# Patient Record
Sex: Female | Born: 1970 | Race: Black or African American | Hispanic: No | Marital: Single | Smoking: Never smoker
Health system: Southern US, Community
[De-identification: ages and names within clinical notes are randomized; demographics above are authoritative.]

## PROBLEM LIST (undated history)

## (undated) DIAGNOSIS — T7840XA Allergy, unspecified, initial encounter: Secondary | ICD-10-CM

## (undated) HISTORY — PX: MYOMECTOMY: SHX85

## (undated) HISTORY — DX: Allergy, unspecified, initial encounter: T78.40XA

---

## 2003-02-17 ENCOUNTER — Other Ambulatory Visit: Admission: RE | Admit: 2003-02-17 | Discharge: 2003-02-17 | Payer: Self-pay | Admitting: Gynecology

## 2004-12-31 ENCOUNTER — Other Ambulatory Visit: Admission: RE | Admit: 2004-12-31 | Discharge: 2004-12-31 | Payer: Self-pay | Admitting: *Deleted

## 2006-02-17 ENCOUNTER — Other Ambulatory Visit: Admission: RE | Admit: 2006-02-17 | Discharge: 2006-02-17 | Payer: Self-pay | Admitting: Gynecology

## 2008-09-29 ENCOUNTER — Other Ambulatory Visit: Admission: RE | Admit: 2008-09-29 | Discharge: 2008-09-29 | Payer: Self-pay | Admitting: Gynecology

## 2008-10-03 ENCOUNTER — Ambulatory Visit (HOSPITAL_COMMUNITY): Admission: RE | Admit: 2008-10-03 | Discharge: 2008-10-03 | Payer: Self-pay | Admitting: Gynecology

## 2012-07-21 ENCOUNTER — Ambulatory Visit (INDEPENDENT_AMBULATORY_CARE_PROVIDER_SITE_OTHER): Payer: BC Managed Care – PPO | Admitting: Family Medicine

## 2012-07-21 VITALS — BP 126/80 | HR 74 | Temp 98.0°F | Resp 18 | Ht 62.5 in | Wt 149.0 lb

## 2012-07-21 DIAGNOSIS — L299 Pruritus, unspecified: Secondary | ICD-10-CM

## 2012-07-21 DIAGNOSIS — L509 Urticaria, unspecified: Secondary | ICD-10-CM

## 2012-07-21 MED ORDER — METHYLPREDNISOLONE 4 MG PO KIT: PACK | ORAL | Status: DC

## 2012-07-21 MED ORDER — METHYLPREDNISOLONE SODIUM SUCC 125 MG IJ SOLR
125.0000 mg | Freq: Once | INTRAMUSCULAR | Status: AC
  Administered 2012-07-21: 125 mg via INTRAMUSCULAR

## 2012-07-21 NOTE — Progress Notes (Signed)
  [facility]:  Office Visit  Chief Complaint:  Chief Complaint  Patient presents with  . itching    all over body since Monday morning    HPI: Hailey Castillo is a 41 y.o. female who complains of  2 day h/o itching all over body. She has red itchy spots all over, eyelids puffy, on face and neck and chest, back legs, and head. On Sunday evening she went to friend's hous out in woods but did not have nay insects bites that she knows of. She also went and ate at buffet at [facility], had lots of grains. She has been on new Vitalyzme enzyme supplemets for fibroids x 10 days but did not have any issues until 2 days ago. She denies any new detergents/soaps, clothes, abx. No prior h/o hives. Denies SOB/CP. Denies food allergies.   Past Medical History  Diagnosis Date  . Allergy    Past Surgical History  Procedure Date  . Myomectomy    History   Social History  . Marital Status: Single    Spouse Name: N/A    Number of Children: N/A  . Years of Education: N/A   Social History Main Topics  . Smoking status: Never Smoker   . Smokeless tobacco: None  . Alcohol Use: No  . Drug Use: No  . Sexually Active: None   Other Topics Concern  . None   Social History Narrative  . None   Family History  Problem Relation Age of Onset  . Hypertension Mother   . Hypertension Father    No Known Allergies Prior to Admission medications   Not on File     ROS: The patient denies fevers, chills, night sweats, unintentional weight loss, chest pain, palpitations, wheezing, dyspnea on exertion, nausea, vomiting, abdominal pain, dysuria, hematuria, melena, numbness, weakness, or tingling.   All other systems have been reviewed and were otherwise negative with the exception of those mentioned in the HPI and as above.    PHYSICAL EXAM: Filed Vitals:   07/21/12 1441  BP: 126/80  Pulse: 74  Temp: 98 F (36.7 C)  Resp: 18   Filed Vitals:   07/21/12 1441  Height: 5' 2.5"  (1.588 m)  Weight: 149 lb (67.586 kg)   Body mass index is 26.82 kg/(m^2).  General: Alert, no acute distress HEENT:  Normocephalic, atraumatic, oropharynx patent.  Cardiovascular:  Regular rate and rhythm, no rubs murmurs or gallops.  No Carotid bruits, radial pulse intact. No pedal edema.  Respiratory: Clear to auscultation bilaterally.  No wheezes, rales, or rhonchi.  No cyanosis, no use of accessory musculature GI: No organomegaly, abdomen is soft and non-tender, positive bowel sounds.  No masses. Skin: + diffuse erythematous maculopapular rash Neurologic: Facial musculature symmetric. Psychiatric: Patient is appropriate throughout our interaction. Lymphatic: No cervical lymphadenopathy Musculoskeletal: Gait intact.   LABS: No results found for this or any previous visit.   EKG/XRAY:   Primary read interpreted by Dr. Conley Rolls at [facility].   ASSESSMENT/PLAN: Encounter Diagnoses  Name Primary?  . Hives Yes  . Urticaria   . Itching    ?Etiology.  Systemic so maybe related to what she ingested . Will give IM steroids since on face and chest and eyelid swelling Solumedrol 125 mg Im x 1 Medrol Dose pack Benadryl 25 mg q6-8 ghrs until resolved Go to ER prn CP, SOB   ,  PHUONG, DO 07/21/2012 3:04 PM

## 2012-07-21 NOTE — Patient Instructions (Signed)
Hives Hives (urticaria) are itchy, red, swollen patches on the skin. They may change size, shape, and location quickly and repeatedly. Hives that occur deeper in the skin can cause swelling of the hands, feet, and face. Hives may be an allergic reaction to something you ate, touched, or put on your skin. Hives can also be a reaction to cold, heat, viral infections, medication, insect bites, or emotional stress. Often the cause is hard to find. Hives can come and go for several days to several weeks. Hives are not contagious. HOME CARE INSTRUCTIONS   If the cause of the hives is known, avoid exposure to that source.   To relieve itching and rash:   Apply cold compresses to the skin or take cool water baths. Do not take hot baths or showers because the warmth will make the itching worse.   The best medicine for hives is an antihistamine. An antihistamine will not cure hives, but it will reduce their severity. You can use an antihistamine available over the counter. This medicine may make you sleepy. You should not drive while using this medicine.   Take or give an antihistamine as directed by your pharmacist or caregiver.   Other medications may be prescribed for itching. Use these as directed.   You should wear loose fitting clothing, including undergarments, as skin irritations may make hives worse.   Follow-up as directed by your caregiver.  SEEK MEDICAL CARE IF:   You still have considerable itching after taking the medication (prescribed or purchased over the counter).   Joint swelling or pain occurs.   You are not improving or are getting worse.  SEEK IMMEDIATE MEDICAL CARE IF:   You have a fever.   You notice any swelling of the lips, tongue, face or neck.   You have shortness of breath, difficulty breathing or swallowing, or tightness in the throat or chest.   Belly (abdominal) pain develops.   You are feeling very sick.   Your hives continue to spread.  These may be the  first signs of a life-threatening allergic reaction. THIS IS AN EMERGENCY. Call 911 for medical help. Document Released: 11/21/2004 Document Revised: 10/03/2011 Document Reviewed: 11/08/2008 Exit[employer] Patient Information 2012 [employer]. 

## 2013-09-25 ENCOUNTER — Ambulatory Visit (INDEPENDENT_AMBULATORY_CARE_PROVIDER_SITE_OTHER): Payer: BC Managed Care – PPO | Admitting: Family Medicine

## 2013-09-25 VITALS — BP 110/64 | HR 79 | Temp 98.6°F | Resp 18 | Ht 63.0 in | Wt 148.0 lb

## 2013-09-25 DIAGNOSIS — H109 Unspecified conjunctivitis: Secondary | ICD-10-CM

## 2013-09-25 MED ORDER — TOBRAMYCIN-DEXAMETHASONE 0.3-0.1 % OP SUSP: 1.0000 [drp] | Freq: Four times a day (QID) | OPHTHALMIC | Status: DC

## 2013-09-25 NOTE — Progress Notes (Signed)
    This chart was scribed for Elvina Sidle, MD  by Arlan Organ, ED Scribe. This patient was seen in room Room 13 and the patient's care was started 11:42 AM.  Patient Name: Hailey Castillo Date of Birth: 01-09-1971 Medical Record Number: [id number] Gender: female Date of Encounter: 09/25/2013  Chief Complaint: Eye Pain   History of Present Illness:  Hailey Castillo is a 42 y.o. very pleasant female patient who presents with the following:  Pt presents with a gradual onset, constant right eye problem that started 2 days ago, but has worsened in the last few hours. She describes the discomfort as "burning and scratchy". Pt states bright light worsens the discomfort, and ibuprofen relieves the pain temporarily. She says when she is driving, she closes the effected eye to keep from bright light causing her discomfort. She reports severe pain at night time.  Recently went to [employer] for a conference.  There are no active problems to display for this patient.  Past Medical History  Diagnosis Date  . Allergy    Past Surgical History  Procedure Laterality Date  . Myomectomy     History  Substance Use Topics  . Smoking status: Never Smoker   . Smokeless tobacco: Not on file  . Alcohol Use: No   Family History  Problem Relation Age of Onset  . Hypertension Mother   . Hypertension Father    Allergies  Allergen Reactions  . Other     Hay fever     Medication list has been reviewed and updated.  Current Outpatient Prescriptions on File Prior to Visit  Medication Sig Dispense Refill  . methylPREDNISolone (MEDROL, PAK,) 4 MG tablet follow package directions  21 tablet  0   No current facility-administered medications on file prior to visit.    Review of Systems: Consitutional: No fever or chills Eye: Positive for right eye pain  Physical Examination: Filed Vitals:   09/25/13 1126  BP: 110/64  Pulse: 79  Temp: 98.6 F (37 C)  Resp: 18   @vitals2 @ Body  mass index is 26.22 kg/(m^2). Ideal Body Weight: @FLOWAMB ([id number])@ Right eye was injected diffusely. 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 Fundus is normal. I lid was everted and no foreign body was noted Conjunctiva was stained with fluorescin and there was no significant uptake over the cornea. She did have significant uptake in the lower third of the sclera.  Assessment and Plan:  Conjunctivitis - Plan: tobramycin-dexamethasone (TOBRADEX) ophthalmic solution  Signed, Elvina Sidle, MD   Elvina Sidle, MD

## 2013-09-25 NOTE — Patient Instructions (Addendum)
Please call me if you are not improving in the next 24 to 48 hours.   Conjunctivitis Conjunctivitis is commonly called "pink eye." Conjunctivitis can be caused by bacterial or viral infection, allergies, or injuries. There is usually redness of the lining of the eye, itching, discomfort, and sometimes discharge. There may be deposits of matter along the eyelids. A viral infection usually causes a watery discharge, while a bacterial infection causes a yellowish, thick discharge. Pink eye is very contagious and spreads by direct contact. You may be given antibiotic eyedrops as part of your treatment. Before using your eye medicine, remove all drainage from the eye by washing gently with warm water and cotton balls. Continue to use the medication until you have awakened 2 mornings in a row without discharge from the eye. Do not rub your eye. This increases the irritation and helps spread infection. Use separate towels from other household members. Wash your hands with soap and water before and after touching your eyes. Use cold compresses to reduce pain and sunglasses to relieve irritation from light. Do not wear contact lenses or wear eye makeup until the infection is gone. SEEK MEDICAL CARE IF:   Your symptoms are not better after 3 days of treatment.  You have increased pain or trouble seeing.  The outer eyelids become very red or swollen. Document Released: 11/21/2004 Document Revised: 01/06/2012 Document Reviewed: 10/14/2005 [facility] Patient Information 2014 [place], [place].

## 2013-09-26 ENCOUNTER — Telehealth: Payer: Self-pay

## 2013-09-26 NOTE — Telephone Encounter (Signed)
Spoke with pt, advised she didn't need the methylprednisolone because it wasn't prescribed. It was a medication she was previously on that we didn't take off. Pt understood.

## 2013-09-26 NOTE — Telephone Encounter (Signed)
Patient states that she would like Methylprednisolone called in to [facility] on [address]. States that she was seen yesterday (09/25/2013) for conjunctivitis and this medication was the only on the list that she was unable to pick up.  [phone]

## 2013-09-27 ENCOUNTER — Ambulatory Visit (INDEPENDENT_AMBULATORY_CARE_PROVIDER_SITE_OTHER): Payer: BC Managed Care – PPO | Admitting: Family Medicine

## 2013-09-27 VITALS — BP 100/70 | HR 80 | Temp 98.5°F | Resp 16 | Ht 62.0 in | Wt 148.0 lb

## 2013-09-27 DIAGNOSIS — H109 Unspecified conjunctivitis: Secondary | ICD-10-CM

## 2013-09-27 DIAGNOSIS — H5710 Ocular pain, unspecified eye: Secondary | ICD-10-CM

## 2013-09-27 DIAGNOSIS — H5711 Ocular pain, right eye: Secondary | ICD-10-CM

## 2013-09-27 NOTE — Progress Notes (Signed)
42 year old woman seen 2 days ago because of right eye pain and redness. She's not had a conjunctivitis and was given TobraDex. She initially got some improvement but over the last 48 hours as noted continued pain in that right eye. Her vision is unchanged. She's not wear contacts, only wears glasses. The pain is constant.  Objective: No acute distress Injected right eye diffusely with pupils equal and reactive to light  Assessment: This appears to be a persistent conjunctival irritation of uncertain etiology. She definitely needs further evaluation at this point.  Plan: Refer to ophthalmologist today  Signed, Sheila Oats.D.

## 2013-09-27 NOTE — Patient Instructions (Signed)
You will have to see an eye doctor today. We are going to send you to Dr Dione Booze at [facility]. His office is at [address] [employer].

## 2014-05-14 ENCOUNTER — Ambulatory Visit (INDEPENDENT_AMBULATORY_CARE_PROVIDER_SITE_OTHER): Payer: 59

## 2014-05-14 ENCOUNTER — Ambulatory Visit (INDEPENDENT_AMBULATORY_CARE_PROVIDER_SITE_OTHER): Payer: 59 | Admitting: Family Medicine

## 2014-05-14 VITALS — BP 102/64 | HR 84 | Temp 98.2°F | Resp 16 | Ht 63.0 in | Wt 136.0 lb

## 2014-05-14 DIAGNOSIS — S8000XA Contusion of unspecified knee, initial encounter: Secondary | ICD-10-CM

## 2014-05-14 DIAGNOSIS — S8002XA Contusion of left knee, initial encounter: Secondary | ICD-10-CM

## 2014-05-14 DIAGNOSIS — M25562 Pain in left knee: Secondary | ICD-10-CM

## 2014-05-14 DIAGNOSIS — M25569 Pain in unspecified knee: Secondary | ICD-10-CM

## 2014-05-14 NOTE — Progress Notes (Addendum)
   Subjective:  This chart was scribed for Robyn Haber, MD by Donato Schultz, Medical Scribe. This patient was seen in Room 13 and the patient's care was started at 10:13 AM.   Patient ID: Hailey Castillo, female    DOB: 12-13-1970, 42 y.o.   MRN: [id number]  HPI HPI Comments: Hailey Castillo is a 43 y.o. female who presents to the [facility] complaining of constant knee pain with associated swelling that started three days ago after the patient fell onto a hardwood floor.  She states that she landed on her knee and denies head injury or LOC at the time of the incident.  The patient denies any other injuries.  She states that she has applied ice and taken Advil at the time of her incident with no relief to her symptoms.  She states that she has not taken Advil over the last two days.  The patient states that she works for [employer] in [place], [place].  She states that she is going to [place] two [place] from now.     Past Medical History  Diagnosis Date  . Allergy    Past Surgical History  Procedure Laterality Date  . Myomectomy     Family History  Problem Relation Age of Onset  . Hypertension Mother   . Hypertension Father    History   Social History  . Marital Status: Single    Spouse Name: N/A    Number of Children: N/A  . Years of Education: N/A   Occupational History  . Not on file.   Social History Main Topics  . Smoking status: Never Smoker   . Smokeless tobacco: Not on file  . Alcohol Use: No  . Drug Use: No  . Sexual Activity: Not on file   Other Topics Concern  . Not on file   Social History Narrative  . No narrative on file   Allergies  Allergen Reactions  . Other     Hay fever     Review of Systems  Musculoskeletal: Positive for arthralgias, gait problem and joint swelling.  All other systems reviewed and are negative.    Objective:  Physical Exam  Nursing note and vitals reviewed. Constitutional: She is oriented to person, place, and  time. She appears well-developed and well-nourished.  HENT:  Head: Normocephalic and atraumatic.  Eyes: EOM are normal.  Neck: Normal range of motion.  Cardiovascular: Normal rate.   Pulmonary/Chest: Effort normal.  Musculoskeletal: Normal range of motion.  Neurological: She is alert and oriented to person, place, and time.  Antalgic gait favoring right side  Skin: Skin is warm and dry.  Psychiatric: She has a normal mood and affect. Her behavior is normal.   [facility] reading (PRIMARY) by  Dr. Joseph Art:  Left knee:  .negative    BP 102/64  Pulse 84  Temp(Src) 98.2 F (36.8 C)  Resp 16  Ht 5\' 3"  (1.6 m)  Wt 136 lb (61.689 kg)  BMI 24.10 kg/m2  SpO2 100%  LMP 04/14/2014 Assessment & Plan:   Recommend Advil 400 tid for 2-3 days and call me back if pain is not subsiding  I personally performed the services described in this documentation, which was scribed in my presence. The recorded information has been reviewed and is accurate.

## 2014-12-21 ENCOUNTER — Ambulatory Visit (INDEPENDENT_AMBULATORY_CARE_PROVIDER_SITE_OTHER): Payer: 59 | Admitting: Family Medicine

## 2014-12-21 VITALS — BP 102/62 | HR 89 | Temp 98.7°F | Resp 18 | Ht 63.0 in | Wt 143.0 lb

## 2014-12-21 DIAGNOSIS — D259 Leiomyoma of uterus, unspecified: Secondary | ICD-10-CM

## 2014-12-21 DIAGNOSIS — D649 Anemia, unspecified: Secondary | ICD-10-CM

## 2014-12-21 DIAGNOSIS — D509 Iron deficiency anemia, unspecified: Secondary | ICD-10-CM

## 2014-12-21 LAB — POCT CBC
Granulocyte percent: 58.2 %G (ref 37–80)
HCT, POC: 30.1 % — AB (ref 37.7–47.9)
Hemoglobin: 8.8 g/dL — AB (ref 12.2–16.2)
LYMPH, POC: 1.8 (ref 0.6–3.4)
MCH: 20.7 pg — AB (ref 27–31.2)
MCHC: 29.3 g/dL — AB (ref 31.8–35.4)
MCV: 70.6 fL — AB (ref 80–97)
MID (CBC): 0.2 (ref 0–0.9)
MPV: 8.4 fL (ref 0–99.8)
PLATELET COUNT, POC: 331 10*3/uL (ref 142–424)
POC GRANULOCYTE: 2.7 (ref 2–6.9)
POC LYMPH PERCENT: 38.3 %L (ref 10–50)
POC MID %: 3.5 % (ref 0–12)
RBC: 4.25 M/uL (ref 4.04–5.48)
RDW, POC: 28.3 %
WBC: 4.6 10*3/uL (ref 4.6–10.2)

## 2014-12-21 LAB — VITAMIN B12: Vitamin B-12: 712 pg/mL (ref 211–911)

## 2014-12-21 LAB — IRON AND TIBC
%SAT: 16 % — AB (ref 20–55)
Iron: 72 ug/dL (ref 42–145)
TIBC: 442 ug/dL (ref 250–470)
UIBC: 370 ug/dL (ref 125–400)

## 2014-12-21 NOTE — Progress Notes (Signed)
Subjective: Patient has a long history of uterine fibroids. She has had a myomectomy done in 2010. She has been seeing someone at [facility] surgery and they said she needed to be treated for her anemia before they could proceed further. They were interested in giving her Lupron and shrinking the uterus before doing another myomectomy. The past she had seen the had of OB/GYN at [facility]. Her hemoglobin was 7 at the surgeon's office a little over 3 weeks ago. They prescribed some very expensive medication for her, but she's been taking some OTC iron and is now starting on a one-day vitamin.  Patient has never had children and desires to preserve her uterus to give herself a chance still.  Objective: No major distress. She does appear pale in her conjunctiva and mucosa and fingernails. Chest is clear. Heart regular without murmurs. Abdomen soft but she has a massive fibroid uterus about 21-22 weeks size on palpation. It is lobulated and a little more to the left.   Results for orders placed or performed in visit on 12/21/14  POCT CBC  Result Value Ref Range   WBC 4.6 4.6 - 10.2 K/uL   Lymph, poc 1.8 0.6 - 3.4   POC LYMPH PERCENT 38.3 10 - 50 %L   MID (cbc) 0.2 0 - 0.9   POC MID % 3.5 0 - 12 %M   POC Granulocyte 2.7 2 - 6.9   Granulocyte percent 58.2 37 - 80 %G   RBC 4.25 4.04 - 5.48 M/uL   Hemoglobin 8.8 (A) 12.2 - 16.2 g/dL   HCT, POC 30.1 (A) 37.7 - 47.9 %   MCV 70.6 (A) 80 - 97 fL   MCH, POC 20.7 (A) 27 - 31.2 pg   MCHC 29.3 (A) 31.8 - 35.4 g/dL   RDW, POC 28.3 %   Platelet Count, POC 331 142 - 424 K/uL   MPV 8.4 0 - 99.8 fL   Assessment: Anemia, probably iron deficient Large uterine fibroids  Plan: Continue the iron. Return in about 3 weeks. I suspect her hemoglobin will be above 11 by that time. Then I think we can start the process of getting her back into the heads of the surgeon's. She is considering going back over to [facility]. Continue her One-A-Day vitamin. Take the  iron twice daily for 1 month, then once daily.

## 2014-12-21 NOTE — Patient Instructions (Signed)
Continue iron twice daily if tolerated for 1 month, then decrease to once daily  Take a little stool softener if necessary  Continue the One-A-Day vitamin  Return in 3 weeks for a recheck

## 2015-01-18 ENCOUNTER — Ambulatory Visit (INDEPENDENT_AMBULATORY_CARE_PROVIDER_SITE_OTHER): Payer: 59 | Admitting: Family Medicine

## 2015-01-18 VITALS — BP 124/74 | HR 68 | Temp 98.2°F | Resp 17 | Ht 63.0 in | Wt 148.0 lb

## 2015-01-18 DIAGNOSIS — D509 Iron deficiency anemia, unspecified: Secondary | ICD-10-CM | POA: Diagnosis not present

## 2015-01-18 LAB — POCT CBC
GRANULOCYTE PERCENT: 59.4 % (ref 37–80)
HCT, POC: 38.8 % (ref 37.7–47.9)
Hemoglobin: 11.7 g/dL — AB (ref 12.2–16.2)
Lymph, poc: 1.6 (ref 0.6–3.4)
MCH, POC: 24.8 pg — AB (ref 27–31.2)
MCHC: 30.3 g/dL — AB (ref 31.8–35.4)
MCV: 81.8 fL (ref 80–97)
MID (CBC): 0.3 (ref 0–0.9)
MPV: 7.9 fL (ref 0–99.8)
PLATELET COUNT, POC: 276 10*3/uL (ref 142–424)
POC Granulocyte: 2.7 (ref 2–6.9)
POC LYMPH %: 34.8 % (ref 10–50)
POC MID %: 5.8 %M (ref 0–12)
RBC: 4.75 M/uL (ref 4.04–5.48)
RDW, POC: 27.3 %
WBC: 4.6 10*3/uL (ref 4.6–10.2)

## 2015-01-18 LAB — FERRITIN: FERRITIN: 12 ng/mL (ref 10–291)

## 2015-01-18 NOTE — Progress Notes (Signed)
Subjective: Patient has been feeling stronger since being on the him. No other major complaints.  Objective: Did not reexamine her today. Reviewed her previous labs which showed severe anemia with hemoglobin 8.8 and a low TIBC  Assessment: Deficiency anemia  Plan: CBC, iron, TIBC, ferritin  Results for orders placed or performed in visit on 01/18/15  POCT CBC  Result Value Ref Range   WBC 4.6 4.6 - 10.2 K/uL   Lymph, poc 1.6 0.6 - 3.4   POC LYMPH PERCENT 34.8 10 - 50 %L   MID (cbc) 0.3 0 - 0.9   POC MID % 5.8 0 - 12 %M   POC Granulocyte 2.7 2 - 6.9   Granulocyte percent 59.4 37 - 80 %G   RBC 4.75 4.04 - 5.48 M/uL   Hemoglobin 11.7 (A) 12.2 - 16.2 g/dL   HCT, POC 38.8 37.7 - 47.9 %   MCV 81.8 80 - 97 fL   MCH, POC 24.8 (A) 27 - 31.2 pg   MCHC 30.3 (A) 31.8 - 35.4 g/dL   RDW, POC 27.3 %   Platelet Count, POC 276 142 - 424 K/uL   MPV 7.9 0 - 99.8 fL   Return in 3 months for recheck and see instructions

## 2015-01-18 NOTE — Patient Instructions (Signed)
Continue taking the iron twice daily through April. Then decrease to once daily.  Return in June for a recheck.

## 2015-01-19 LAB — IBC PANEL
%SAT: 11 % — AB (ref 20–55)
TIBC: 398 ug/dL (ref 250–470)
UIBC: 354 ug/dL (ref 125–400)

## 2015-01-19 LAB — IRON: Iron: 44 ug/dL (ref 42–145)

## 2015-01-21 ENCOUNTER — Encounter: Payer: Self-pay | Admitting: Family Medicine

## 2015-11-13 ENCOUNTER — Ambulatory Visit (INDEPENDENT_AMBULATORY_CARE_PROVIDER_SITE_OTHER): Payer: 59 | Admitting: Emergency Medicine

## 2015-11-13 VITALS — BP 122/80 | HR 77 | Temp 98.2°F | Resp 16 | Ht 62.0 in | Wt 146.0 lb

## 2015-11-13 DIAGNOSIS — R197 Diarrhea, unspecified: Secondary | ICD-10-CM | POA: Diagnosis not present

## 2015-11-13 DIAGNOSIS — K921 Melena: Secondary | ICD-10-CM

## 2015-11-13 LAB — POCT CBC
GRANULOCYTE PERCENT: 56.2 % (ref 37–80)
HEMATOCRIT: 39.2 % (ref 37.7–47.9)
Hemoglobin: 13.2 g/dL (ref 12.2–16.2)
Lymph, poc: 1.5 (ref 0.6–3.4)
MCH: 29.4 pg (ref 27–31.2)
MCHC: 33.8 g/dL (ref 31.8–35.4)
MCV: 87 fL (ref 80–97)
MID (cbc): 0.2 (ref 0–0.9)
MPV: 7.1 fL (ref 0–99.8)
PLATELET COUNT, POC: 214 10*3/uL (ref 142–424)
POC GRANULOCYTE: 2.2 (ref 2–6.9)
POC LYMPH %: 38.7 % (ref 10–50)
POC MID %: 5.1 %M (ref 0–12)
RBC: 4.5 M/uL (ref 4.04–5.48)
RDW, POC: 12.8 %
WBC: 3.9 10*3/uL — AB (ref 4.6–10.2)

## 2015-11-13 LAB — IFOBT (OCCULT BLOOD): IMMUNOLOGICAL FECAL OCCULT BLOOD TEST: POSITIVE

## 2015-11-13 NOTE — Patient Instructions (Signed)
Bloody Diarrhea ° ° °Bloody diarrhea can be caused by many different conditions. Most of the time bloody diarrhea is the result of food poisoning or minor infections. Bloody diarrhea usually improves over 2 to 3 days of rest and fluid replacement. Other conditions that can cause bloody diarrhea include: °· Internal bleeding. °· Infection. °· Diseases of the bowel and colon. °Internal bleeding from an ulcer or bowel disease can be severe and requires hospital care or even surgery. °DIAGNOSIS  °To find out what is wrong your caregiver may check your: °· Stool. °· Blood. °· Results from a test that looks inside the body (endoscopy). °TREATMENT  °· Get plenty of rest. °· Drink enough water and fluids to keep your urine clear or pale yellow. °· Do not smoke. °· Solid foods and dairy products should be avoided until your illness improves. °· As you improve, slowly return to a regular diet with easily-digested foods first. Examples are: °¨ Bananas. °¨ Rice. °¨ Toast. °¨ Crackers. °You should only need these for about 2 days before adding more normal foods to your diet. °· Avoid spicy or fatty foods as well as caffeine and alcohol for several days. °· Medicine to control cramping and diarrhea can relieve symptoms but may prolong some cases of bloody diarrhea. Antibiotics can speed recovery from diarrhea due to some bacterial infections. Call your caregiver if diarrhea does not get better in 3 days. °SEEK MEDICAL CARE IF:  °· You do not improve after 3 days. °· Your diarrhea improves but your stool appears black. °SEEK IMMEDIATE MEDICAL CARE IF:  °· You become extremely weak or faint. °· You become very sweaty. °· You have increased pain or bleeding. °· You develop repeated vomiting. °· You vomit and you see blood or the vomit looks black in color. °· You have a fever. °  °This information is not intended to replace advice given to you by your health care provider. Make sure you discuss any questions you have with your  health care provider. °  °Document Released: 10/14/2005 Document Revised: 11/04/2014 Document Reviewed: 09/15/2009 °Elsevier Interactive Patient Education ©2016 [employer] ° °

## 2015-11-13 NOTE — Progress Notes (Signed)
Subjective:  Patient ID: Hailey Castillo, female    DOB: [birth year]  Age: 45 y.o. MRN: [id number]  CC: blood in stool   HPI Hailey Castillo presents   Patient has a lifelong history of constipation. Over the last several weeks she's developed diarrhea. She has noted blood in her stool. Denies any mucus or pus. She has no history of travel or drinking questionable water. She has no family history of inflammatory bowel disease ulcerative colitis or Crohn's. She has no fever or chills. No nausea vomiting. She is drinking normally. She has no ill contacts. Really has no abdominal pain. She's fearful of having cancer". She's had no weight loss.  History Hailey Castillo has a past medical history of Allergy.   She has past surgical history that includes Myomectomy.   Her  family history includes Hypertension in her father and mother.  She   reports that she has never smoked. She has never used smokeless tobacco. She reports that she does not drink alcohol or use illicit drugs.  No outpatient prescriptions prior to visit.   No facility-administered medications prior to visit.    Social History   Social History  . Marital Status: Single    Spouse Name: N/A  . Number of Children: N/A  . Years of Education: N/A   Social History Main Topics  . Smoking status: Never Smoker   . Smokeless tobacco: Never Used  . Alcohol Use: No  . Drug Use: No  . Sexual Activity: Not Asked   Other Topics Concern  . None   Social History Narrative     Review of Systems  Constitutional: Negative for fever, chills and appetite change.  HENT: Negative for congestion, ear pain, postnasal drip, sinus pressure and sore throat.   Eyes: Negative for pain and redness.  Respiratory: Negative for cough, shortness of breath and wheezing.   Cardiovascular: Negative for leg swelling.  Gastrointestinal: Positive for diarrhea and anal bleeding. Negative for nausea, vomiting, abdominal pain, constipation and blood in  stool.  Endocrine: Negative for polyuria.  Genitourinary: Negative for dysuria, urgency, frequency and flank pain.  Musculoskeletal: Negative for gait problem.  Skin: Negative for rash.  Neurological: Negative for weakness and headaches.  Psychiatric/Behavioral: Negative for confusion and decreased concentration. The patient is not nervous/anxious.     Objective:  BP 122/80 mmHg  Pulse 77  Temp(Src) 98.2 F (36.8 C) (Oral)  Resp 16  Ht 5\' 2"  (1.575 m)  Wt 146 lb (66.225 kg)  BMI 26.70 kg/m2  SpO2 98%  LMP 10/28/2015 (Exact Date)  Physical Exam  Constitutional: She is oriented to person, place, and time. She appears well-developed and well-nourished.  HENT:  Head: Normocephalic and atraumatic.  Eyes: Conjunctivae are normal. Pupils are equal, round, and reactive to light.  Pulmonary/Chest: Effort normal.  Genitourinary: Rectal exam shows no external hemorrhoid, no internal hemorrhoid, no fissure, no mass, no tenderness and anal tone normal.  Musculoskeletal: She exhibits no edema.  Neurological: She is alert and oriented to person, place, and time.  Skin: Skin is dry.  Psychiatric: She has a normal mood and affect. Her behavior is normal. Thought content normal.      Assessment & Plan:   Hailey Castillo was seen today for blood in stool.  Diagnoses and all orders for this visit:  Diarrhea, unspecified type -     Gastrointestinal Pathogen Panel PCR -     Ambulatory referral to Gastroenterology -     POCT CBC -  IFOBT POC (occult bld, rslt in office) -     Ova and parasite examination -     Stool culture  Hematochezia -     Gastrointestinal Pathogen Panel PCR -     Ambulatory referral to Gastroenterology -     POCT CBC -     IFOBT POC (occult bld, rslt in office) -     Ova and parasite examination -     Stool culture  Hailey Castillo does not currently have medications on file.  No orders of the defined types were placed in this encounter.    Appropriate red flag  conditions were discussed with the patient as well as actions that should be taken.  Patient expressed his understanding.  Follow-up: Return if symptoms worsen or fail to improve.  Roselee Culver, MD   Results for orders placed or performed in visit on 11/13/15  POCT CBC  Result Value Ref Range   WBC 3.9 (A) 4.6 - 10.2 K/uL   Lymph, poc 1.5 0.6 - 3.4   POC LYMPH PERCENT 38.7 10 - 50 %L   MID (cbc) 0.2 0 - 0.9   POC MID % 5.1 0 - 12 %M   POC Granulocyte 2.2 2 - 6.9   Granulocyte percent 56.2 37 - 80 %G   RBC 4.50 4.04 - 5.48 M/uL   Hemoglobin 13.2 12.2 - 16.2 g/dL   HCT, POC 39.2 37.7 - 47.9 %   MCV 87.0 80 - 97 fL   MCH, POC 29.4 27 - 31.2 pg   MCHC 33.8 31.8 - 35.4 g/dL   RDW, POC 12.8 %   Platelet Count, POC 214 142 - 424 K/uL   MPV 7.1 0 - 99.8 fL  IFOBT POC (occult bld, rslt in office)  Result Value Ref Range   IFOBT Positive

## 2015-11-15 LAB — GASTROINTESTINAL PATHOGEN PANEL PCR
C. difficile Tox A/B, PCR: NEGATIVE
CRYPTOSPORIDIUM, PCR: NEGATIVE
Campylobacter, PCR: NEGATIVE
E COLI (STEC) STX1/STX2, PCR: NEGATIVE
E coli (ETEC) LT/ST PCR: NEGATIVE
E coli 0157, PCR: NEGATIVE
GIARDIA LAMBLIA, PCR: NEGATIVE
NOROVIRUS, PCR: NEGATIVE
ROTAVIRUS, PCR: NEGATIVE
SHIGELLA, PCR: NEGATIVE
Salmonella, PCR: NEGATIVE

## 2015-11-16 LAB — OVA AND PARASITE EXAMINATION: OP: NONE SEEN

## 2015-11-19 LAB — STOOL CULTURE

## 2015-11-25 ENCOUNTER — Ambulatory Visit (INDEPENDENT_AMBULATORY_CARE_PROVIDER_SITE_OTHER): Payer: 59 | Admitting: Urgent Care

## 2015-11-25 VITALS — BP 120/70 | HR 86 | Temp 98.1°F | Resp 16 | Ht 63.0 in | Wt 148.0 lb

## 2015-11-25 DIAGNOSIS — R195 Other fecal abnormalities: Secondary | ICD-10-CM

## 2015-11-25 DIAGNOSIS — Z8719 Personal history of other diseases of the digestive system: Secondary | ICD-10-CM | POA: Diagnosis not present

## 2015-11-25 DIAGNOSIS — K921 Melena: Secondary | ICD-10-CM | POA: Diagnosis not present

## 2015-11-25 NOTE — Patient Instructions (Signed)
Please let me know if you have not yet set up an appointment to be evaluated by a GI doctor in the next 2 weeks.

## 2015-11-25 NOTE — Progress Notes (Signed)
    MRN: [id number] DOB: 01-Nov-1970  Subjective:   Hailey Castillo is a 45 y.o. female presenting for follow up on bloody stools. Was last seen on 11/15/2015. Reports that her bloody stools have improved. Still has loose stools, has 1-2 bowel movements which is significantly improved from the diarrhea she was having. Has a history of constipation for most of her life, bowel movements every 2-3. Admits that she tries to eat healthily and exercises. Denies fever, anal pain, history of hemorrhoids, n/v, abdominal pain, fecaluria. Has a history of C-section and myomectomy in 02/2015. Denies history of IBS, UC, Crohn's disease. Denies family history of colon cancer.  Hailey Castillo currently has no medications in their medication list. Also is allergic to other.  Hailey Castillo  has a past medical history of Allergy. Also  has past surgical history that includes Myomectomy.  Objective:   Vitals: BP 120/70 mmHg  Pulse 86  Temp(Src) 98.1 F (36.7 C) (Oral)  Resp 16  Ht 5\' 3"  (1.6 m)  Wt 148 lb (67.132 kg)  BMI 26.22 kg/m2  SpO2 98%  LMP 10/28/2015 (Exact Date)  Wt Readings from Last 3 Encounters:  11/25/15 148 lb (67.132 kg)  11/13/15 146 lb (66.225 kg)  01/18/15 148 lb (67.132 kg)   Physical Exam  Constitutional: She is oriented to person, place, and time. She appears well-developed and well-nourished.  Cardiovascular: Normal rate, regular rhythm and intact distal pulses.  Exam reveals no gallop and no friction rub.   No murmur heard. Pulmonary/Chest: No respiratory distress. She has no wheezes. She has no rales.  Abdominal: Soft. Bowel sounds are normal. She exhibits no distension and no mass. There is no tenderness.  Neurological: She is alert and oriented to person, place, and time.  Skin: Skin is warm and dry.   Assessment and Plan :   1. Bloody stools 2. Loose stools 3. History of constipation - Referral to GI pending. Counseled patient on differential, it is reassuring to me that she has had  improvement but due to frank blood in stool and absence of external hemorrhoids, negative GI panel, will refer to GI for further evaluation. Patient is in agreement.  Jaynee Eagles, PA-C [facility] [phone] 11/25/2015 2:33 PM

## 2016-04-26 ENCOUNTER — Emergency Department (HOSPITAL_COMMUNITY)
Admission: EM | Admit: 2016-04-26 | Discharge: 2016-04-26 | Disposition: A | Discharge status: Home / Self Care | Payer: 59 | Attending: Emergency Medicine | Admitting: Emergency Medicine

## 2016-04-26 ENCOUNTER — Emergency Department (HOSPITAL_COMMUNITY): Payer: 59

## 2016-04-26 ENCOUNTER — Encounter (HOSPITAL_COMMUNITY): Payer: Self-pay | Admitting: Emergency Medicine

## 2016-04-26 ENCOUNTER — Ambulatory Visit (INDEPENDENT_AMBULATORY_CARE_PROVIDER_SITE_OTHER): Payer: 59 | Admitting: Physician Assistant

## 2016-04-26 ENCOUNTER — Telehealth: Payer: Self-pay

## 2016-04-26 VITALS — BP 104/78 | HR 83 | Temp 98.2°F | Resp 18 | Ht 63.0 in | Wt 140.0 lb

## 2016-04-26 DIAGNOSIS — R079 Chest pain, unspecified: Secondary | ICD-10-CM | POA: Diagnosis not present

## 2016-04-26 DIAGNOSIS — R55 Syncope and collapse: Secondary | ICD-10-CM

## 2016-04-26 DIAGNOSIS — I951 Orthostatic hypotension: Secondary | ICD-10-CM | POA: Diagnosis not present

## 2016-04-26 LAB — BASIC METABOLIC PANEL
Anion gap: 7 (ref 5–15)
BUN: 8 mg/dL (ref 6–20)
CHLORIDE: 110 mmol/L (ref 101–111)
CO2: 21 mmol/L — AB (ref 22–32)
CREATININE: 0.73 mg/dL (ref 0.44–1.00)
Calcium: 8.9 mg/dL (ref 8.9–10.3)
GFR calc Af Amer: >60 mL/min (ref >60)
GFR calc non Af Amer: >60 mL/min (ref >60)
Glucose, Bld: 85 mg/dL (ref 65–99)
Potassium: 3.5 mmol/L (ref 3.5–5.1)
SODIUM: 138 mmol/L (ref 135–145)

## 2016-04-26 LAB — CBC
HCT: 40.1 % (ref 36.0–46.0)
Hemoglobin: 13.1 g/dL (ref 12.0–15.0)
MCH: 28.7 pg (ref 26.0–34.0)
MCHC: 32.7 g/dL (ref 30.0–36.0)
MCV: 87.7 fL (ref 78.0–100.0)
PLATELETS: 226 10*3/uL (ref 150–400)
RBC: 4.57 MIL/uL (ref 3.87–5.11)
RDW: 11.8 % (ref 11.5–15.5)
WBC: 5.9 10*3/uL (ref 4.0–10.5)

## 2016-04-26 LAB — I-STAT TROPONIN, ED: Troponin i, poc: 0 ng/mL (ref 0.00–0.08)

## 2016-04-26 NOTE — ED Notes (Signed)
PT reports 100/57 is a normal BP for her. PT reports it is routinely in the low 100s

## 2016-04-26 NOTE — ED Notes (Signed)
PT fainted yesterday on a carpeted floor. PT reports she got up from her couch, felt dizzy and incoherent. PT estimates LOC 5 minutes. When she woke up, PT reports she felt very nauseated and vomited for about 3 hours after this episode. PT reports this occurred at 2pm. PT denies any new meds. PT has never had issues with fainting or hypotension or vertigo.   PT is still dizzy. PT reports slight nausea.  Dizzy this AM, hasn't eaten in over 1 day. Increased dizziness with standing

## 2016-04-26 NOTE — ED Provider Notes (Signed)
CSN: [id number]     Arrival date & time 04/26/16  1225 History   First MD Initiated Contact with Patient 04/26/16 1247     Chief Complaint  Patient presents with  . Loss of Consciousness   (Consider location/radiation/quality/duration/timing/severity/associated sxs/prior Treatment) HPI 45 y.o. female presents to the Emergency Department today due to fainting yesterday around 2pm. Pt states that she was sitting on her couch working from home. Pt stood up and felt vision closing. Has Nausea and dizziness as well. States that she passed out. Unsure of duration of LOC. Woke up with N/V/D. X 3 hours. Subsided since then. No hx seizures. No hx similar. No CP/SOB prior. No diaphoresis. No headaches. Pt noted not eating anything prior that day. Pt felling well now. States worsened symptoms with standing with regard to dizziness. No fevers. No other symptoms noted   Past Medical History  Diagnosis Date  . Allergy    Past Surgical History  Procedure Laterality Date  . Myomectomy     Family History  Problem Relation Age of Onset  . Hypertension Mother   . Hypertension Father    Social History  Substance Use Topics  . Smoking status: Never Smoker   . Smokeless tobacco: Never Used  . Alcohol Use: No   OB History    No data available     Review of Systems ROS reviewed and all are negative for acute change except as noted in the HPI.  Allergies  Other  Home Medications   Prior to Admission medications   Not on File   BP 107/71 mmHg  Pulse 62  Temp(Src) 99.2 F (37.3 C) (Oral)  Resp 14  SpO2 97%  LMP 04/03/2016   Physical Exam  Constitutional: She is oriented to person, place, and time. She appears well-developed and well-nourished.  HENT:  Head: Normocephalic and atraumatic.  Lips dry. Conjunctiva pale  Eyes: EOM are normal. Pupils are equal, round, and reactive to light.  Neck: Normal range of motion.  Cardiovascular: Normal rate, regular rhythm, normal heart sounds and  intact distal pulses.   No murmur heard. Pulmonary/Chest: Effort normal and breath sounds normal. No respiratory distress. She has no wheezes. She has no rales. She exhibits no tenderness.  Abdominal: Soft. Bowel sounds are normal. There is no tenderness.  Musculoskeletal: Normal range of motion.  Neurological: She is alert and oriented to person, place, and time. She has normal strength. No cranial nerve deficit or sensory deficit.  Cranial Nerves:  II: Pupils equal, round, reactive to light III,IV, VI: ptosis not present, extra-ocular motions intact bilaterally  V,VII: smile symmetric, facial light touch sensation equal VIII: hearing grossly normal bilaterally  IX,X: midline uvula rise  XI: bilateral shoulder shrug equal and strong XII: midline tongue extension  Skin: Skin is warm and dry.  Psychiatric: She has a normal mood and affect. Her behavior is normal. Thought content normal.  Nursing note and vitals reviewed.  ED Course  Procedures (including critical care time) Labs Review Labs Reviewed  BASIC METABOLIC PANEL - Abnormal; Notable for the following:    CO2 21 (*)    All other components within normal limits  CBC  I-STAT TROPOININ, ED   Imaging Review Ct Head Wo Contrast  04/26/2016  CLINICAL DATA:  Syncopal episode yesterday with loss of consciousness, initial encounter EXAM: CT HEAD WITHOUT CONTRAST TECHNIQUE: Contiguous axial images were obtained from the base of the skull through the vertex without intravenous contrast. COMPARISON:  None. FINDINGS: The bony calvarium  is intact. The ventricles are of normal size and configuration. No findings to suggest acute hemorrhage, acute infarction or space-occupying mass lesion are noted. IMPRESSION: No acute intracranial abnormality noted. Electronically Signed   By: Inez Catalina M.D.   On: 04/26/2016 14:33   I have personally reviewed and evaluated these images and lab results as part of my medical decision-making.   EKG  Interpretation   Date/Time:  Friday April 26 2016 12:34:23 EDT Ventricular Rate:  64 PR Interval:    QRS Duration: 87 QT Interval:  374 QTC Calculation: 386 R Axis:   78 Text Interpretation:  Sinus rhythm Low voltage, precordial leads  Borderline T abnormalities, anterior leads No old tracing to compare  Confirmed by [facility]  MD, MARTHA [phone]) on 04/26/2016 12:53:55 PM      MDM  I have reviewed and evaluated the relevant laboratory valuesI have reviewed and evaluated the relevant imaging studies. I have interpreted the relevant EKG.I have reviewed the relevant previous healthcare records.I obtained HPI from historian. Patient discussed with supervising physician  ED Course:  Assessment: Pt is a 45yF who presents with syncopal episode around 2pm yesterday after standing from cough. Unsure of head trauma. Had LOC. Unsure of duration. No hx seizures. No hx of similar. No CP or diaphoresis prior. On exam, pt in NAD. Nontoxic/nonseptic appearing. VSS. Afebrile. Lungs CTA. Heart RRR. Abdomen nontender soft. CN evaluated and unremarkable. iStat trop negative. CBC/BMP unremarkable. EKG unremarkable. CT Head unremarkable. Given fluids in ED with symptom improvement. Orthostatics positive. Likely dehydration related. Plan is to DC home with follow up to PCP. At time of discharge, Patient is in no acute distress. Vital Signs are stable. Patient is able to ambulate. Patient able to tolerate PO.    Disposition/Plan:  DC Home Additional Verbal discharge instructions given and discussed with patient.  Pt Instructed to f/u with PCP in the next week for evaluation and treatment of symptoms. Return precautions given Pt acknowledges and agrees with plan  Supervising Physician Alfonzo Beers, MD   Final diagnoses:  Orthostatic hypotension     Shary Decamp, PA-C 04/26/16 [place], MD 04/26/16 1440

## 2016-04-26 NOTE — ED Notes (Signed)
PT tolerates ambulation well. PT reports she is feeling much better and dizziness has nearly completely resolved.

## 2016-04-26 NOTE — Progress Notes (Signed)
[facility] [address] [place] [address] [phone]  Date:  04/26/2016   Name:  Hailey Castillo   DOB:  [birth year]   MRN:  [id number]  PCP:  PROVIDER NOT IN SYSTEM    History of Present Illness:  Hailey Castillo is a 45 y.o. female patient who presents to [facility]   Chest pains since yesterday.  She was getting up from couch and felt dizzy.  Room was spinning.  She passed out.  Lasted a couple minutes.  She was in the bathroom throwing up and having excessive diarrhea.  After throwing up, chest pains.  She had sob.  No hx of thyroid issues.  No hx of diabetes.  No heavy fatigue, polyuria.  No respiratory illnesses.  Equilibrium, could not stand for a long period.  She had some broth.  No diarrhea or vomiting since yesterday. Currently having dizziness, and cloudiness. No incontinence with no bodyaches.  Sternum pain that did not radiate.  She was diaphoretic and heated.  Day before with chik fil-a 12 piece meal and lemonade. No abdominal pain.  No calorie counting.  Fruit nut bar at lunch time.  She has no hx of hypoglycemia.  This has never happened before.  Intentional weight loss from walking. Fibroid surgery last year.   Light 04/03/2016.  Not currently sexually active.   Wt Readings from Last 3 Encounters:  04/26/16 140 lb (63.504 kg)  11/25/15 148 lb (67.132 kg)  11/13/15 146 lb (66.225 kg)    There are no active problems to display for this patient.   Past Medical History  Diagnosis Date  . Allergy     Past Surgical History  Procedure Laterality Date  . Myomectomy      Social History  Substance Use Topics  . Smoking status: Never Smoker   . Smokeless tobacco: Never Used  . Alcohol Use: No    Family History  Problem Relation Age of Onset  . Hypertension Mother   . Hypertension Father     Allergies  Allergen Reactions  . Other     Hay fever     Medication list has been reviewed and updated.  No current outpatient prescriptions on  file prior to visit.   No current facility-administered medications on file prior to visit.    ROS ROS otherwise unremarkable unless listed above.  Physical Examination: BP 104/78 mmHg  Pulse 83  Temp(Src) 98.2 F (36.8 C) (Oral)  Resp 18  Ht 5\' 3"  (1.6 m)  Wt 140 lb (63.504 kg)  BMI 24.81 kg/m2  SpO2 98%  LMP 04/03/2016 Ideal Body Weight: Weight in (lb) to have BMI = 25: 140.8  Physical Exam  Constitutional: She is oriented to person, place, and time. She appears well-developed and well-nourished. No distress.  HENT:  Head: Normocephalic and atraumatic.  Right Ear: External ear normal.  Left Ear: External ear normal.  Eyes: Conjunctivae and EOM are normal. Pupils are equal, round, and reactive to light.  Neck: No thyromegaly present.  Cardiovascular: Normal rate, regular rhythm and intact distal pulses.  Exam reveals no friction rub.   No murmur heard. Pulses:      Carotid pulses are 2+ on the right side, and 2+ on the left side. Pulmonary/Chest: Effort normal and breath sounds normal. No respiratory distress. She has no wheezes.  Abdominal: Soft. Bowel sounds are normal. She exhibits no distension. There is no tenderness.  Neurological: She is alert and oriented to person, place,  and time. She displays normal reflexes. She exhibits normal muscle tone.  Skin: Skin is warm and dry. She is not diaphoretic.  Psychiatric: She has a normal mood and affect. Her behavior is normal.   Orthostatic VS for the past 24 hrs (Last 3 readings):  BP- Lying Pulse- Lying BP- Sitting Pulse- Sitting BP- Standing at 0 minutes Pulse- Standing at 0 minutes  04/26/16 0914 97/65 mmHg 67 95/65 mmHg 73 105/74 mmHg 91    Assessment and Plan: Hailey Castillo is a 45 y.o. female who is here today for dizziness, diarrhea, nausea and vomiting.  Result of syncope is unknown as this happened prior to having the N,V,D.  Currently dizzy and hypotensive.  I am advising that she be sent to the ED for  evaluation.   Patient agreeable to plan. -Chest pain, unspecified chest pain type - Plan: EKG 12-Lead, POCT CBC, POCT glycosylated hemoglobin (Hb A1C), POCT glucose (manual entry), POCT urinalysis dipstick, TSH, COMPLETE METABOLIC PANEL WITH GFR  Syncope and collapse - Plan: POCT CBC, POCT glycosylated hemoglobin (Hb A1C), POCT glucose (manual entry), POCT urinalysis dipstick, TSH, COMPLETE METABOLIC PANEL WITH GFR  Ivar Drape, PA-C [facility] 7/1/20178:32 AM    1. Chest pain, unspecified chest pain type - EKG 12-Lead   Ivar Drape, PA-C [facility] 04/26/2016 9:41 AM

## 2016-04-26 NOTE — Patient Instructions (Signed)
     IF you received an x-ray today, you will receive an invoice from [facility]. Please contact [facility] at [phone] with questions or concerns regarding your invoice.   IF you received labwork today, you will receive an invoice from [facility]. Please contact [facility] at [phone] with questions or concerns regarding your invoice.   Our billing staff will not be able to assist you with questions regarding bills from these companies.  You will be contacted with the lab results as soon as they are available. The fastest way to get your results is to activate your My Chart account. Instructions are located on the last page of this paperwork. If you have not heard from us regarding the results in 2 weeks, please contact this office.      

## 2016-04-26 NOTE — Discharge Instructions (Signed)
Please read and follow all provided instructions.  Your diagnoses today include:  1. Orthostatic hypotension   2. LOC (loss of consciousness)    Tests performed today include:  Vital signs. See below for your results today.   Medications prescribed:   Take as prescribed   Home care instructions:  Follow any educational materials contained in this packet.  Follow-up instructions: Please follow-up with your primary care provider for further evaluation of symptoms and treatment next week  Return instructions:   Please return to the Emergency Department if you do not get better, if you get worse, or new symptoms OR  - Fever (temperature greater than 101.60F)  - Bleeding that does not stop with holding pressure to the area    -Severe pain (please note that you may be more sore the day after your accident)  - Chest Pain  - Difficulty breathing  - Severe nausea or vomiting  - Inability to tolerate food and liquids  - Passing out  - Skin becoming red around your wounds  - Change in mental status (confusion or lethargy)  - New numbness or weakness     Please return if you have any other emergent concerns.  Additional Information:  Your vital signs today were: BP 123/75 mmHg   Pulse 74   Temp(Src) 99.2 F (37.3 C) (Oral)   Resp 18   SpO2 99%   LMP 04/03/2016 If your blood pressure (BP) was elevated above 135/85 this visit, please have this repeated by your doctor within one month. ---------------

## 2016-05-11 NOTE — Telephone Encounter (Signed)
error 

## 2016-08-21 ENCOUNTER — Encounter (HOSPITAL_COMMUNITY): Payer: Self-pay

## 2016-08-21 ENCOUNTER — Emergency Department (HOSPITAL_COMMUNITY)
Admission: EM | Admit: 2016-08-21 | Discharge: 2016-08-21 | Disposition: A | Discharge status: Home / Self Care | Payer: 59 | Attending: Emergency Medicine | Admitting: Emergency Medicine

## 2016-08-21 DIAGNOSIS — M25552 Pain in left hip: Secondary | ICD-10-CM | POA: Diagnosis present

## 2016-08-21 DIAGNOSIS — G5702 Lesion of sciatic nerve, left lower limb: Secondary | ICD-10-CM | POA: Insufficient documentation

## 2016-08-21 MED ORDER — MELOXICAM 15 MG PO TABS: 15.0000 mg | ORAL_TABLET | Freq: Every day | ORAL | 0 refills | Status: DC

## 2016-08-21 MED ORDER — PREDNISONE 20 MG PO TABS: 40.0000 mg | ORAL_TABLET | Freq: Every day | ORAL | 0 refills | Status: DC

## 2016-08-21 NOTE — ED Triage Notes (Signed)
Patient complains of intermittent bilateral hip pain for several weeks, denies injury. Does get relief with rest and ibuprofen, ambulatory and in NAD

## 2016-08-21 NOTE — ED Provider Notes (Signed)
[facility] DEPT Provider Note   CSN: [id number] Arrival date & time: 08/21/16  [id number]  By signing my name below, I, Sonum Patel, attest that this documentation has been prepared under the direction and in the presence of Geneva Barrero, PA-C. Electronically Signed: Sonum Patel, Education administrator. 08/21/16. 10:59 AM.  History   Chief Complaint Chief Complaint  Patient presents with  . Hip Pain   The history is provided by the patient. No language interpreter was used.    HPI Comments: Hailey Castillo is a 45 y.o. female who presents to the Emergency Department complaining of 1 year of intermittent episodes of non-radiating buttocks pain with the current episode starting 6 days ago. She states the episodes alternate from left buttock to right buttock. She states usually the pain presents after completing lunges and squats which resolves with rest. She states this current episode began while driving back on a 4 hour drive. She denies recent falls or trauma to the affected area. She has taken ibuprofen and tumeric with some relief. She denies urinary incontinence, numbness, saddle anesthesia.  Past Medical History:  Diagnosis Date  . Allergy     There are no active problems to display for this patient.   Past Surgical History:  Procedure Laterality Date  . MYOMECTOMY      OB History    No data available       Home Medications    Prior to Admission medications   Medication Sig Start Date End Date Taking? Authorizing Provider  ibuprofen (ADVIL,MOTRIN) 600 MG tablet Take 600 mg by mouth every 6 (six) hours as needed. pain 03/16/15   Historical Provider, MD  Multiple Vitamin (DAILY VALUE MULTIVITAMIN) TABS Take 1 tablet by mouth daily. 06/23/12   Historical Provider, MD    Family History Family History  Problem Relation Age of Onset  . Hypertension Mother   . Hypertension Father     Social History Social History  Substance Use Topics  . Smoking status: Never Smoker  .  Smokeless tobacco: Never Used  . Alcohol use No     Allergies   Other   Review of Systems Review of Systems  Genitourinary:       -incontinence  Musculoskeletal: Positive for arthralgias and myalgias.  Neurological: Negative for numbness.     Physical Exam Updated Vital Signs BP 100/55 (BP Location: Right Arm)   Pulse 85   Temp 98.3 F (36.8 C) (Oral)   Resp 18   SpO2 100%   Physical Exam  Constitutional: She is oriented to person, place, and time. She appears well-developed and well-nourished.  HENT:  Head: Normocephalic and atraumatic.  Neck: Neck supple.  Cardiovascular: Normal rate and intact distal pulses.   Pulmonary/Chest: Effort normal.  Musculoskeletal: Normal range of motion.  No tenderness to palpation over midline lumbar spine or SI joints bilaterally. Tenderness to palpation over the left buttock. No pain with straight leg raise.  Neurological: She is alert and oriented to person, place, and time.  5/5 and equal lower extremity strength. 2+ and equal patellar reflexes bilaterally. Pt able to dorsiflex bilateral toes and feet with good strength against resistance. Equal sensation bilaterally over thighs and lower legs.   Skin: Skin is warm and dry.  Psychiatric: She has a normal mood and affect.  Nursing note and vitals reviewed.    ED Treatments / Results  DIAGNOSTIC STUDIES: Oxygen Saturation is 100% on RA, normal by my interpretation.    COORDINATION OF CARE: 11:01 AM Discussed treatment  plan with pt at bedside and pt agreed to plan.    Labs (all labs ordered are listed, but only abnormal results are displayed) Labs Reviewed - No data to display  EKG  EKG Interpretation None       Radiology No results found.  Procedures Procedures (including critical care time)  Medications Ordered in ED Medications - No data to display   Initial Impression / Assessment and Plan / ED Course  I have reviewed the triage vital signs and the  nursing notes.  Pertinent labs & imaging results that were available during my care of the patient were reviewed by me and considered in my medical decision making (see chart for details).  Clinical Course    Patient in emergency department with recurrent pain to the left buttock. This recurred after long drive. Patient in the past with exercises. Pain in the buttock radiating down into the upper posterior thigh. No neurological symptoms or complaints otherwise. No lumbar spine pain or tenderness. Neurovascularly intact. Most likely perform a syndrome. Will start on prednisone burst, mobile, follow-up with orthopedics. No evidence of cauda equina. No injuries. I do not think she needs any imaging on emergent basis.  Vitals:   08/21/16 0922  BP: 100/55  Pulse: 85  Resp: 18  Temp: 98.3 F (36.8 C)  TempSrc: Oral  SpO2: 100%     Final Clinical Impressions(s) / ED Diagnoses   Final diagnoses:  None    New Prescriptions New Prescriptions   MELOXICAM (MOBIC) 15 MG TABLET    Take 1 tablet (15 mg total) by mouth daily.   PREDNISONE (DELTASONE) 20 MG TABLET    Take 2 tablets (40 mg total) by mouth daily.   I personally performed the services described in this documentation, which was scribed in my presence. The recorded information has been reviewed and is accurate.    Jeannett Senior, PA-C 08/21/16 Patrick, MD 08/21/16 [phone]

## 2016-08-21 NOTE — Discharge Instructions (Signed)
Take Mobic daily for pain and inflammation. Take prednisone as prescribed until all gone. You can take Tylenol in addition for pain relief. Follow-up with orthopedics.

## 2016-08-21 NOTE — ED Notes (Signed)
Pt is in stable condition upon d/c and ambulates from ED. [facility] 162

## 2017-08-23 IMAGING — CT CT HEAD W/O CM
4 series · 16 of 47 positions shown, 18 images · non-contrast
Comparison: None.

CLINICAL DATA: Syncopal episode yesterday with loss of
consciousness, initial encounter

EXAM:
CT HEAD WITHOUT CONTRAST
TECHNIQUE: Contiguous axial images were obtained from the base of the skull
through the vertex without intravenous contrast.

[Series 2: head without · axial · non-contrast · 0.44mm/px · z∈[-152,-37]mm · 7 of 31 slices shown, 9 images]
[im 4/31  brain]
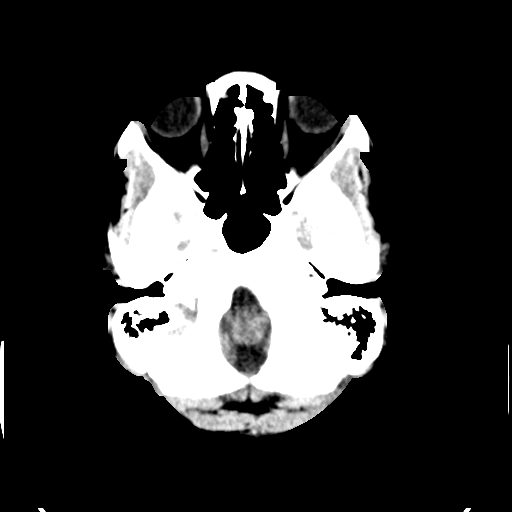
[im 4/31  bone]
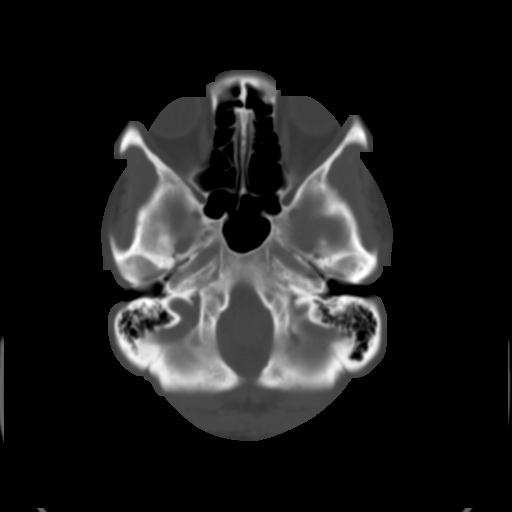
[im 8/31  brain]
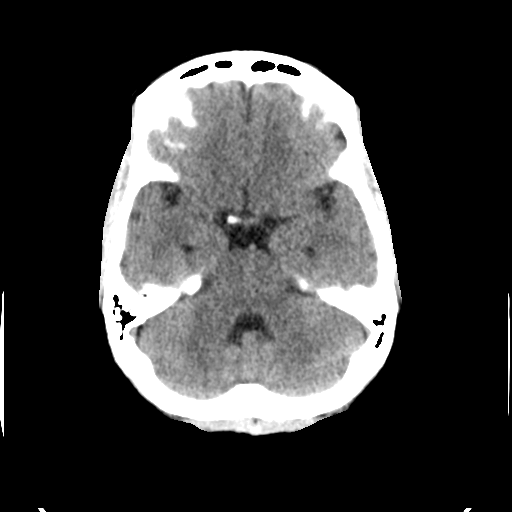
[im 12/31  brain]
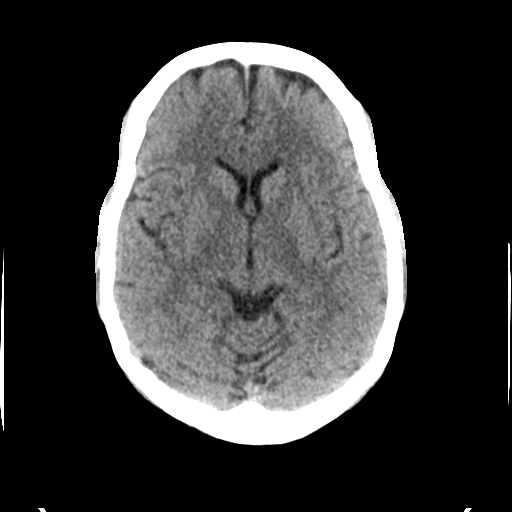
[im 16/31  brain]
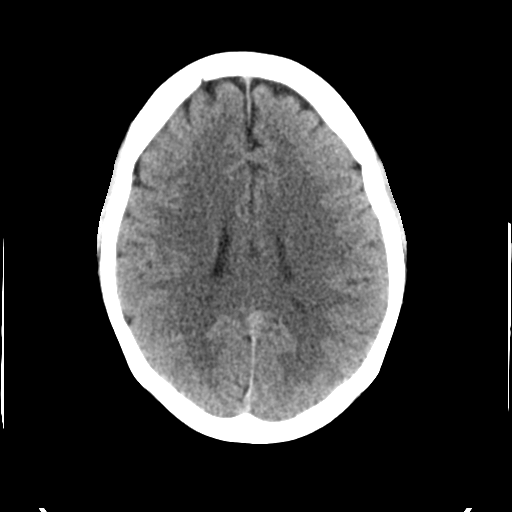
[im 19/31  brain]
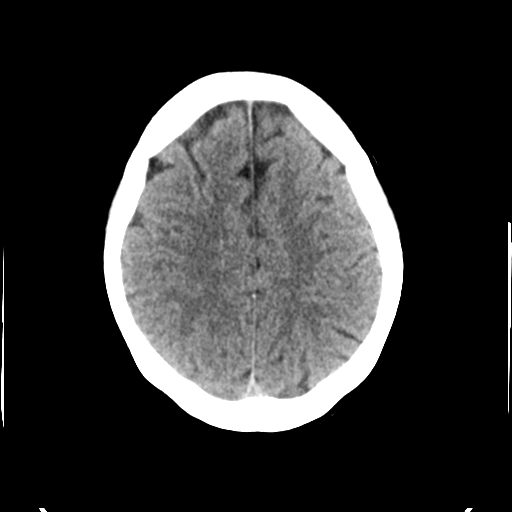
[im 19/31  bone]
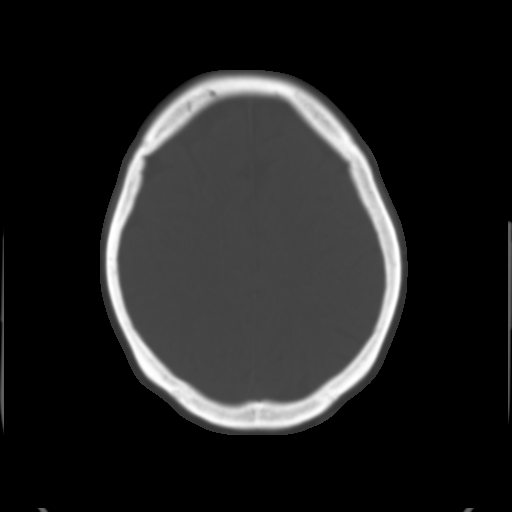
[im 23/31  brain]
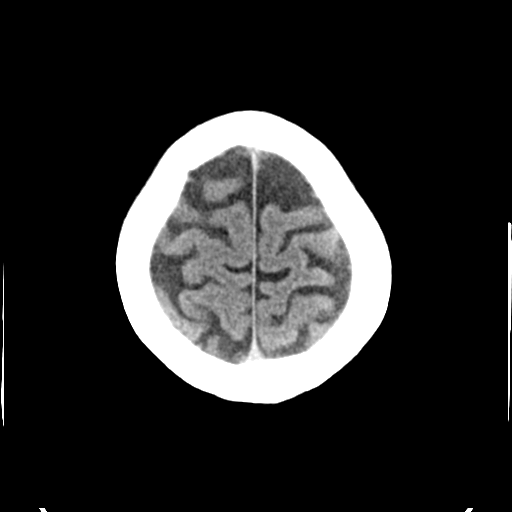
[im 27/31  brain]
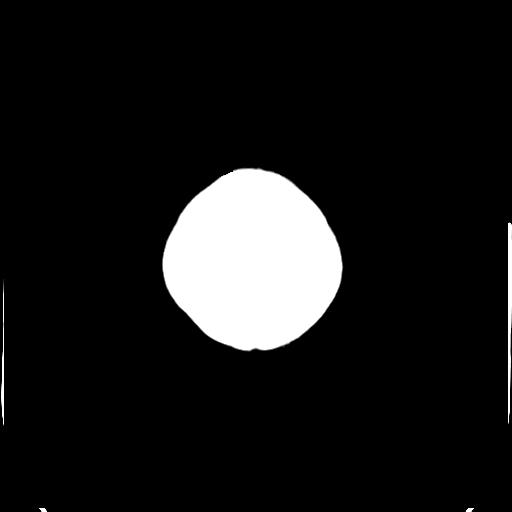

[Series 3: head bone · axial · 0.44mm/px · z∈[-153,-123]mm · 3 of 76 slices shown]
[im 8/76  bone]
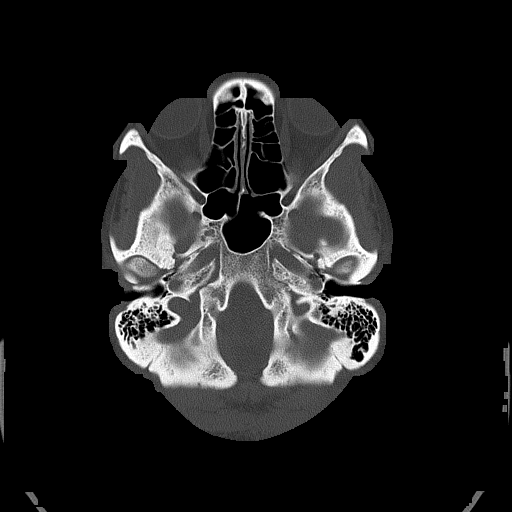
[im 16/76  bone]
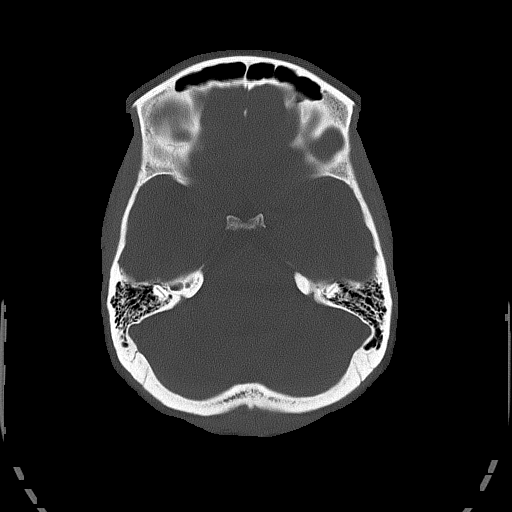
[im 23/76  bone]
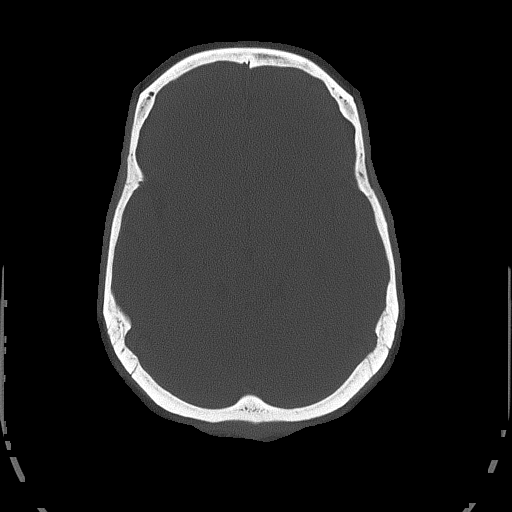

[Series 4: head without cor · coronal · non-contrast · 0.30mm/px · 3 of 67 slices shown]
[im 23/67  brain]
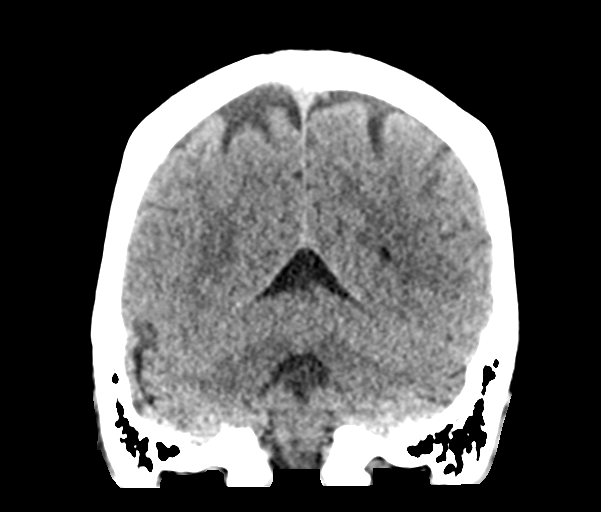
[im 30/67  brain]
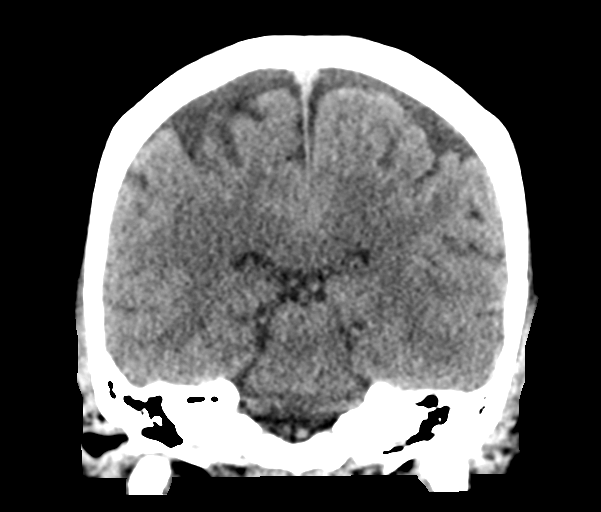
[im 37/67  brain]
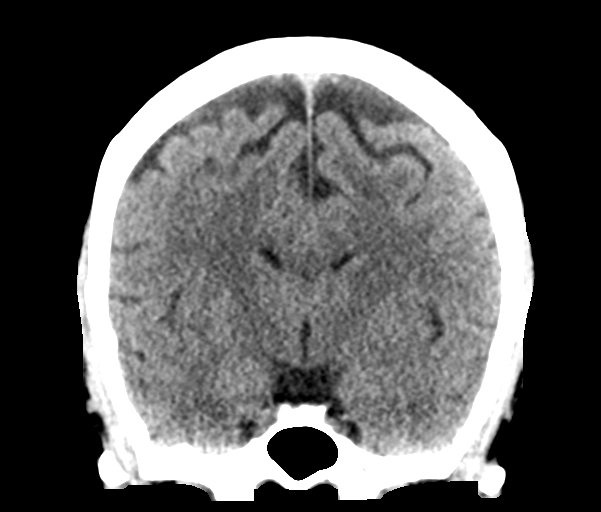

[Series 5: head without sag · sagittal · non-contrast · 0.30mm/px · 3 of 67 slices shown]
[im 23/67  brain]
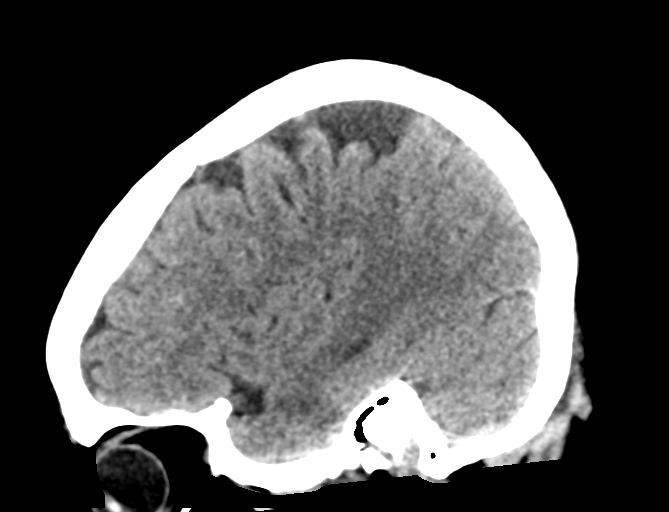
[im 34/67  brain]
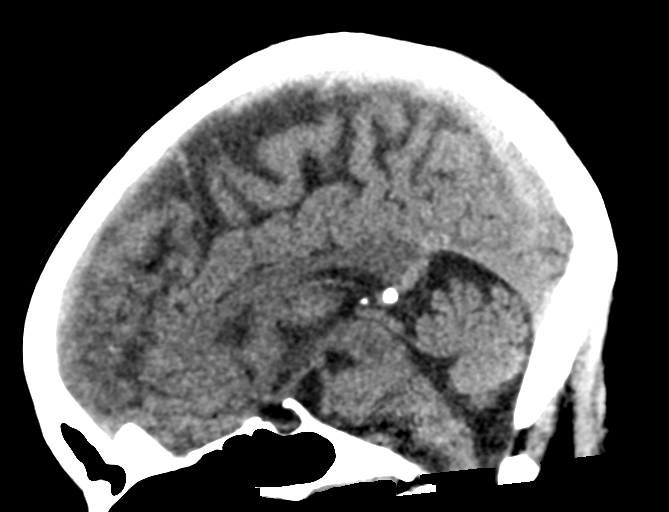
[im 45/67  brain]
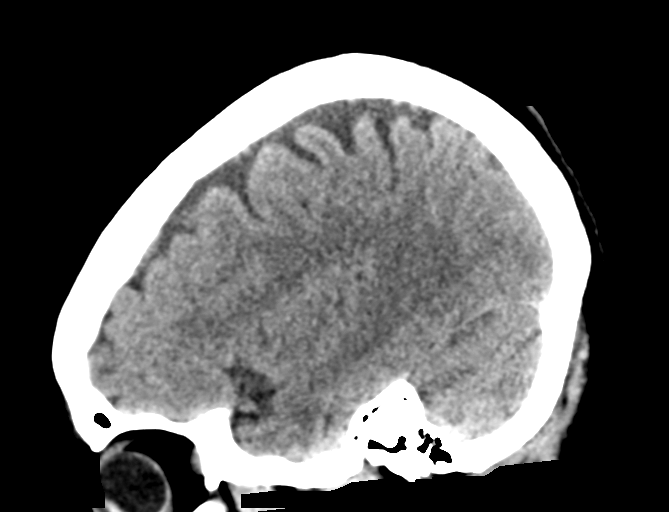

[16 of 47 positions shown; findings below may reference images not displayed]

FINDINGS: The bony calvarium is intact. The ventricles are of normal size and
configuration. No findings to suggest acute hemorrhage, acute
infarction or space-occupying mass lesion are noted.
IMPRESSION: No acute intracranial abnormality noted.

## 2017-11-04 ENCOUNTER — Other Ambulatory Visit: Payer: Self-pay

## 2017-11-04 ENCOUNTER — Encounter: Payer: Self-pay | Admitting: Family Medicine

## 2017-11-04 ENCOUNTER — Ambulatory Visit: Payer: 59 | Admitting: Family Medicine

## 2017-11-04 VITALS — BP 102/62 | HR 84 | Temp 99.0°F | Ht 62.0 in | Wt 145.0 lb

## 2017-11-04 DIAGNOSIS — G5702 Lesion of sciatic nerve, left lower limb: Secondary | ICD-10-CM

## 2017-11-04 MED ORDER — CYCLOBENZAPRINE HCL 5 MG PO TABS: 5.0000 mg | ORAL_TABLET | Freq: Three times a day (TID) | ORAL | 0 refills | Status: AC | PRN

## 2017-11-04 MED ORDER — PREDNISONE 20 MG PO TABS: 40.0000 mg | ORAL_TABLET | Freq: Every day | ORAL | 0 refills | Status: AC

## 2017-11-04 NOTE — Patient Instructions (Signed)
     IF you received an x-ray today, you will receive an invoice from [facility]. Please contact  Radiology at [phone] with questions or concerns regarding your invoice.   IF you received labwork today, you will receive an invoice from [facility]. Please contact [facility] at [phone] with questions or concerns regarding your invoice.   Our billing staff will not be able to assist you with questions regarding bills from these companies.  You will be contacted with the lab results as soon as they are available. The fastest way to get your results is to activate your My Chart account. Instructions are located on the last page of this paperwork. If you have not heard from us regarding the results in 2 weeks, please contact this office.     

## 2017-11-04 NOTE — Progress Notes (Signed)
   1/8/201911:58 AM  Hailey Castillo Jan 04, 1971, 47 y.o. female [id number]  Chief Complaint  Patient presents with  . Hip Pain    FOR 1 YR IN BOTH SIDES    HPI:   Patient is a 47 y.o. female  who presents today for flareup of left side buttock/hip pain. Has had intermittent pain on both hips for over past year. Currently flare up started 2 days ago, ibuprofen not helping, pain does not radiate, denies any trauma, fever, chills, changes in bowel or bladder function.   Depression screen [facility] 2/9 11/04/2017 04/26/2016 04/26/2016  Decreased Interest 0 0 0  Down, Depressed, Hopeless 0 0 0  PHQ - 2 Score 0 0 0    Allergies  Allergen Reactions  . Other     Hay fever     Prior to Admission medications   Medication Sig Start Date End Date Taking? Authorizing Provider  ibuprofen (ADVIL,MOTRIN) 600 MG tablet Take 600 mg by mouth every 6 (six) hours as needed. pain 03/16/15  Yes [provider]    Past Medical History:  Diagnosis Date  . Allergy     Past Surgical History:  Procedure Laterality Date  . MYOMECTOMY      Social History   Tobacco Use  . Smoking status: Never Smoker  . Smokeless tobacco: Never Used  Substance Use Topics  . Alcohol use: No    Alcohol/week: 0.0 oz    Family History  Problem Relation Age of Onset  . Hypertension Mother   . Hypertension Father     ROS Per hpi  OBJECTIVE:  Blood pressure 102/62, pulse 84, temperature 99 F (37.2 C), temperature source Oral, height 5\' 2"  (1.575 m), weight 145 lb (65.8 kg), SpO2 98 %.  Physical Exam  Constitutional: She is oriented to person, place, and time and well-developed, well-nourished, and in no distress.  Patient moves in pain to and from exam table  HENT:  Head: Normocephalic and atraumatic.  Mouth/Throat: Oropharynx is clear and moist. No oropharyngeal exudate.  Eyes: EOM are normal. Pupils are equal, round, and reactive to light. No scleral icterus.  Neck: Neck supple.  Cardiovascular:  Normal rate, regular rhythm and normal heart sounds. Exam reveals no gallop and no friction rub.  No murmur heard. Pulmonary/Chest: Effort normal and breath sounds normal. She has no wheezes. She has no rales.  Musculoskeletal: She exhibits no edema.       Right hip: Normal.       Left hip: Normal.       Lumbar back: Normal.  + Left piriformis TTP  Neurological: She is alert and oriented to person, place, and time. She has normal strength and normal reflexes. She has a normal Straight Leg Raise Test. Gait normal.  Skin: Skin is warm and dry.   ASSESSMENT and PLAN  1. Piriformis syndrome of left side Discussed supportive measures, new meds r/se/b and RTC precautions. Patient educational handout given. - predniSONE (DELTASONE) 20 MG tablet; Take 2 tablets (40 mg total) by mouth daily. - cyclobenzaprine (FLEXERIL) 5 MG tablet; Take 1 tablet (5 mg total) by mouth 3 (three) times daily as needed for muscle spasms.  Return if symptoms worsen or fail to improve.    Rutherford Guys, MD [facility] [place], [place] [address] Ph.  [phone] Fax [phone]
